# Patient Record
Sex: Female | Born: 2000 | Race: Black or African American | Hispanic: No | Marital: Single | Smoking: Never smoker
Health system: Southern US, Community
[De-identification: ages and names within clinical notes are randomized; demographics above are authoritative.]

## PROBLEM LIST (undated history)

## (undated) ENCOUNTER — Ambulatory Visit (HOSPITAL_COMMUNITY): Payer: Self-pay | Source: Home / Self Care

## (undated) HISTORY — PX: APPENDECTOMY: SHX54

---
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
## 2021-04-30 ENCOUNTER — Encounter: Payer: Self-pay | Admitting: Emergency Medicine

## 2021-04-30 ENCOUNTER — Ambulatory Visit
Admission: EM | Admit: 2021-04-30 | Discharge: 2021-04-30 | Discharge status: Against Medical Advice | Payer: PRIVATE HEALTH INSURANCE

## 2021-04-30 NOTE — ED Triage Notes (Signed)
States she was sent home early from work on Wednesday due to a stomach bug. Her manager says they are going to fire her if she doesn't come in with a doctors note today. States she's feeling better today.

## 2021-04-30 NOTE — ED Notes (Signed)
Patient checked in. Asked if we could give her a work note for yesterday, and that she needed to be discharged in 15 minutes because she had to be at work. Registration told patient she may not be discharged in that amount of time. She stated she would wait to be seen in her car. Called for triage by nurse, no answer. Called phone 3 times, no answer. Removing patient from board.

## 2021-05-12 ENCOUNTER — Encounter (HOSPITAL_COMMUNITY): Payer: Self-pay | Admitting: Emergency Medicine

## 2021-05-12 ENCOUNTER — Ambulatory Visit (HOSPITAL_COMMUNITY)
Admission: EM | Admit: 2021-05-12 | Discharge: 2021-05-12 | Disposition: A | Discharge status: Home / Self Care | Payer: Self-pay | Attending: Medical Oncology | Admitting: Medical Oncology

## 2021-05-12 ENCOUNTER — Other Ambulatory Visit: Payer: Self-pay

## 2021-05-12 DIAGNOSIS — Z20822 Contact with and (suspected) exposure to covid-19: Secondary | ICD-10-CM | POA: Insufficient documentation

## 2021-05-12 DIAGNOSIS — J029 Acute pharyngitis, unspecified: Secondary | ICD-10-CM | POA: Insufficient documentation

## 2021-05-12 DIAGNOSIS — J069 Acute upper respiratory infection, unspecified: Secondary | ICD-10-CM

## 2021-05-12 LAB — POC INFLUENZA A AND B ANTIGEN (URGENT CARE ONLY)
INFLUENZA A ANTIGEN, POC: NEGATIVE
INFLUENZA B ANTIGEN, POC: NEGATIVE

## 2021-05-12 LAB — SARS CORONAVIRUS 2 (TAT 6-24 HRS): SARS Coronavirus 2: NEGATIVE

## 2021-05-12 MED ORDER — FLUTICASONE PROPIONATE 50 MCG/ACT NA SUSP: 2.0000 | Freq: Every day | NASAL | 0 refills | Status: DC

## 2021-05-12 MED ORDER — BENZONATATE 100 MG PO CAPS
100.0000 mg | ORAL_CAPSULE | Freq: Three times a day (TID) | ORAL | 0 refills | Status: DC
Start: 2021-05-12 — End: 2022-06-30
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
## 2021-05-12 NOTE — ED Notes (Signed)
No answer in lobby.

## 2021-05-12 NOTE — ED Notes (Signed)
Called number on file, directly to voicemail with no identifying message.

## 2021-05-12 NOTE — ED Notes (Signed)
Covid and flu swab labeled at bedside and placed in lab

## 2021-05-12 NOTE — ED Triage Notes (Signed)
Complains of sore throat, headache, body aches that started yesterday

## 2022-06-30 ENCOUNTER — Other Ambulatory Visit: Payer: Self-pay

## 2022-06-30 ENCOUNTER — Ambulatory Visit
Admission: EM | Admit: 2022-06-30 | Discharge: 2022-06-30 | Disposition: A | Discharge status: Home / Self Care | Payer: Medicaid Other | Attending: Family Medicine | Admitting: Family Medicine

## 2022-06-30 ENCOUNTER — Encounter: Payer: Self-pay | Admitting: Emergency Medicine

## 2022-06-30 DIAGNOSIS — R051 Acute cough: Secondary | ICD-10-CM | POA: Diagnosis not present

## 2022-06-30 LAB — POCT INFLUENZA A/B
Influenza A, POC: NEGATIVE
Influenza B, POC: NEGATIVE
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
## 2022-06-30 NOTE — ED Triage Notes (Signed)
Pt here for cough x 4 days; denies fever

## 2022-06-30 NOTE — Discharge Instructions (Addendum)
Please reference your MyChart for your rapid flu results.  I would take Robitussin DM or Delsym as needed for your cough

## 2022-08-28 ENCOUNTER — Emergency Department (HOSPITAL_COMMUNITY): Payer: Medicaid Other

## 2022-08-28 ENCOUNTER — Other Ambulatory Visit: Payer: Self-pay

## 2022-08-28 ENCOUNTER — Emergency Department (HOSPITAL_COMMUNITY)
Admission: EM | Admit: 2022-08-28 | Discharge: 2022-08-28 | Disposition: A | Discharge status: Home / Self Care | Payer: Medicaid Other | Attending: Emergency Medicine | Admitting: Emergency Medicine

## 2022-08-28 ENCOUNTER — Encounter (HOSPITAL_COMMUNITY): Payer: Self-pay

## 2022-08-28 DIAGNOSIS — Z1152 Encounter for screening for COVID-19: Secondary | ICD-10-CM | POA: Diagnosis not present

## 2022-08-28 DIAGNOSIS — R042 Hemoptysis: Secondary | ICD-10-CM

## 2022-08-28 LAB — CBC WITH DIFFERENTIAL/PLATELET
Abs Immature Granulocytes: 0.02 10*3/uL (ref 0.00–0.07)
Basophils Absolute: 0.1 10*3/uL (ref 0.0–0.1)
Basophils Relative: 1 %
Eosinophils Absolute: 0.1 10*3/uL (ref 0.0–0.5)
Eosinophils Relative: 1 %
HCT: 46 % (ref 36.0–46.0)
Hemoglobin: 15.8 g/dL — ABNORMAL HIGH (ref 12.0–15.0)
Immature Granulocytes: 0 %
Lymphocytes Relative: 36 %
Lymphs Abs: 3.4 10*3/uL (ref 0.7–4.0)
MCH: 31.5 pg (ref 26.0–34.0)
MCHC: 34.3 g/dL (ref 30.0–36.0)
MCV: 91.6 fL (ref 80.0–100.0)
Monocytes Absolute: 0.8 10*3/uL (ref 0.1–1.0)
Monocytes Relative: 9 %
Neutro Abs: 5.1 10*3/uL (ref 1.7–7.7)
Neutrophils Relative %: 53 %
Platelets: 178 10*3/uL (ref 150–400)
RBC: 5.02 MIL/uL (ref 3.87–5.11)
RDW: 12.2 % (ref 11.5–15.5)
WBC: 9.5 10*3/uL (ref 4.0–10.5)
nRBC: 0 % (ref 0.0–0.2)

## 2022-08-28 LAB — RESP PANEL BY RT-PCR (RSV, FLU A&B, COVID)  RVPGX2
Influenza A by PCR: NEGATIVE
Influenza B by PCR: NEGATIVE
Resp Syncytial Virus by PCR: NEGATIVE
SARS Coronavirus 2 by RT PCR: NEGATIVE

## 2022-08-28 LAB — COMPREHENSIVE METABOLIC PANEL
ALT: 15 U/L (ref 0–44)
AST: 21 U/L (ref 15–41)
Albumin: 5.1 g/dL — ABNORMAL HIGH (ref 3.5–5.0)
Alkaline Phosphatase: 73 U/L (ref 38–126)
Anion gap: 15 (ref 5–15)
BUN: 14 mg/dL (ref 6–20)
CO2: 17 mmol/L — ABNORMAL LOW (ref 22–32)
Calcium: 10.1 mg/dL (ref 8.9–10.3)
Chloride: 105 mmol/L (ref 98–111)
Creatinine, Ser: 0.81 mg/dL (ref 0.44–1.00)
GFR, Estimated: >60 mL/min (ref >60)
Glucose, Bld: 92 mg/dL (ref 70–99)
Potassium: 3.8 mmol/L (ref 3.5–5.1)
Sodium: 137 mmol/L (ref 135–145)
Total Bilirubin: 1 mg/dL (ref 0.3–1.2)
Total Protein: 8.7 g/dL — ABNORMAL HIGH (ref 6.5–8.1)

## 2022-08-28 LAB — I-STAT BETA HCG BLOOD, ED (MC, WL, AP ONLY): I-stat hCG, quantitative: <5 m[IU]/mL (ref <5)

## 2022-08-28 MED ORDER — IOHEXOL 350 MG/ML SOLN
75.0000 mL | Freq: Once | INTRAVENOUS | Status: AC | PRN
  Administered 2022-08-28: 75 mL via INTRAVENOUS
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
## 2022-08-28 NOTE — ED Notes (Signed)
Pt ambulated to restroom without incident.
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
## 2023-05-02 ENCOUNTER — Ambulatory Visit
Admission: EM | Admit: 2023-05-02 | Discharge: 2023-05-02 | Disposition: A | Discharge status: Home / Self Care | Payer: Medicaid Other | Attending: Internal Medicine | Admitting: Internal Medicine

## 2023-05-02 ENCOUNTER — Ambulatory Visit: Payer: Medicaid Other

## 2023-05-02 DIAGNOSIS — M79641 Pain in right hand: Secondary | ICD-10-CM

## 2023-05-02 NOTE — ED Notes (Signed)
Call patient on number listed above. No answer or voicemail to leave a message.
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
## 2023-05-02 NOTE — ED Triage Notes (Signed)
Right hand pain; Pt stated she was punching the stairs this morning when she injured her hand.  Pt is able to move hand and fingers.

## 2023-05-02 NOTE — Discharge Instructions (Signed)
We will call if x-ray results are abnormal.  Elevate and apply ice.  Follow-up with orthopedist if pain persists or worsens.

## 2023-05-03 ENCOUNTER — Telehealth: Payer: Self-pay

## 2023-05-03 NOTE — Telephone Encounter (Signed)
Returning call to patient regarding the following (incoming call):  "Hi, MRN [id number] S. Simones was seen last night, she has a question regarding some xray results and an additional work note. [phone]".   LM for patient to return call and/or respond via MyChart when possible.  B. Roten CMA

## 2023-05-03 NOTE — Telephone Encounter (Signed)
"  I have a very serious concern, my hand & pain is still not better". Advised patient (after discussion with R. Reggie Pile and previously with Ervin Knack, NP, follow up with [facility] (tonight, prior to 8pm) since pain is still present.  B. Roten CMA

## 2023-05-06 ENCOUNTER — Emergency Department (HOSPITAL_COMMUNITY): Payer: Medicaid Other

## 2023-05-06 ENCOUNTER — Encounter (HOSPITAL_COMMUNITY): Payer: Self-pay

## 2023-05-06 ENCOUNTER — Emergency Department (HOSPITAL_COMMUNITY)
Admission: EM | Admit: 2023-05-06 | Discharge: 2023-05-06 | Disposition: A | Discharge status: Home / Self Care | Payer: Medicaid Other | Attending: Emergency Medicine | Admitting: Emergency Medicine

## 2023-05-06 ENCOUNTER — Other Ambulatory Visit: Payer: Self-pay

## 2023-05-06 DIAGNOSIS — S6991XA Unspecified injury of right wrist, hand and finger(s), initial encounter: Secondary | ICD-10-CM | POA: Diagnosis present

## 2023-05-06 DIAGNOSIS — M79641 Pain in right hand: Secondary | ICD-10-CM

## 2023-05-06 DIAGNOSIS — W228XXA Striking against or struck by other objects, initial encounter: Secondary | ICD-10-CM | POA: Insufficient documentation

## 2023-05-06 MED ORDER — NAPROXEN 375 MG PO TABS: 375.0000 mg | ORAL_TABLET | Freq: Two times a day (BID) | ORAL | 0 refills | Status: DC

## 2023-05-06 MED ORDER — IBUPROFEN 800 MG PO TABS
800.0000 mg | ORAL_TABLET | Freq: Once | ORAL | Status: AC
  Administered 2023-05-06: 800 mg via ORAL
  Filled 2023-05-06: qty 1

## 2023-05-06 NOTE — ED Provider Notes (Signed)
[facility] Provider Note   CSN: [id number] Arrival date & time: 05/06/23  1409     History  Chief Complaint  Patient presents with   Hand Pain    [name] is a 22 y.o. female who presents emergency department for evaluation of her right hand injury.  She was seen for this already at an urgent care had an outpatient x-ray that was negative for acute injuries wearing a radial gutter removable Velcro brace and has follow-up with orthopedics but states they did not give her anything for pain.  She also did not take anything at home for pain.  She has not used ice.  The injury is to her right hand.  She is left-hand dominant.  She denies numbness or tingling.   Hand Pain       Home Medications Prior to Admission medications   Medication Sig Start Date End Date Taking? Authorizing Provider  fluticasone (FLONASE) 50 MCG/ACT nasal spray Place 2 sprays into both nostrils daily. 05/12/21   Rushie Chestnut, PA-C  diphenhydrAMINE (BENADRYL) 12.5 MG/5ML liquid Take 10 mLs (25 mg total) by mouth every 6 (six) hours as needed for itching. 05/26/13 06/03/19  Marcellina Millin, MD      Allergies    Patient has no known allergies.    Review of Systems   Review of Systems  Physical Exam Updated Vital Signs BP 115/78 (BP Location: Left Arm)   Pulse 84   Temp 98.7 F (37.1 C)   Resp 14   Ht 5\' 4"  (1.626 m)   Wt 57.6 kg   LMP 04/30/2023 (Approximate)   SpO2 100%   BMI 21.80 kg/m  Physical Exam Vitals and nursing note reviewed.  Constitutional:      General: She is not in acute distress.    Appearance: She is well-developed. She is not diaphoretic.  HENT:     Head: Normocephalic and atraumatic.     Right Ear: External ear normal.     Left Ear: External ear normal.     Nose: Nose normal.     Mouth/Throat:     Mouth: Mucous membranes are moist.  Eyes:     General: No scleral icterus.    Conjunctiva/sclera: Conjunctivae normal.   Cardiovascular:     Rate and Rhythm: Normal rate and regular rhythm.     Heart sounds: Normal heart sounds. No murmur heard.    No friction rub. No gallop.  Pulmonary:     Effort: Pulmonary effort is normal. No respiratory distress.     Breath sounds: Normal breath sounds.  Abdominal:     General: Bowel sounds are normal. There is no distension.     Palpations: Abdomen is soft. There is no mass.     Tenderness: There is no abdominal tenderness. There is no guarding.  Musculoskeletal:     Cervical back: Normal range of motion.     Comments: Brace removed, no obvious swelling or bruising.  Old appearing subungual hematoma of the right thumb.  Cap refill less than 2 seconds.  Patient resistant to movement of the fingers due to pain.  Pulses 2+  Skin:    General: Skin is warm and dry.  Neurological:     Mental Status: She is alert and oriented to person, place, and time.  Psychiatric:        Behavior: Behavior normal.     ED Results / Procedures / Treatments   Labs (all labs ordered are  listed, but only abnormal results are displayed) Labs Reviewed - No data to display  EKG None  Radiology DG Hand Complete Right  Result Date: 05/06/2023 CLINICAL DATA:  Right hand pain EXAM: RIGHT HAND - COMPLETE 3+ VIEW COMPARISON:  None Available. FINDINGS: There is no evidence of fracture or dislocation. There is no evidence of arthropathy or other focal bone abnormality. Soft tissues are unremarkable. IMPRESSION: Negative. Electronically Signed   By: Judie Petit.  Shick M.D.   On: 05/06/2023 14:37    Procedures Procedures    Medications Ordered in ED Medications  ibuprofen (ADVIL) tablet 800 mg (has no administration in time range)    ED Course/ Medical Decision Making/ A&P                                 Medical Decision Making Amount and/or Complexity of Data Reviewed Radiology: ordered.  Risk Prescription drug management.   Patient here with right hand pain.  Patient given ibuprofen  here in the emergency department.  Will discharge with naproxen.  Follow-up with Ortho hand.  Discussed return precautions.        Final Clinical Impression(s) / ED Diagnoses Final diagnoses:  Hand pain, right    Rx / DC Orders ED Discharge Orders     None         Arthor Captain, PA-C 05/06/23 1542    Charlynne Pander, MD 05/06/23 [phone]

## 2023-05-06 NOTE — Discharge Instructions (Signed)
Follow up with [facility] Return to the ER with any new or worsening symptoms
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
## 2023-10-07 ENCOUNTER — Emergency Department (HOSPITAL_COMMUNITY)
Admission: EM | Admit: 2023-10-07 | Discharge: 2023-10-08 | Discharge status: Against Medical Advice | Payer: Self-pay | Attending: Emergency Medicine | Admitting: Emergency Medicine

## 2023-10-07 DIAGNOSIS — Z5321 Procedure and treatment not carried out due to patient leaving prior to being seen by health care provider: Secondary | ICD-10-CM | POA: Insufficient documentation

## 2023-10-07 NOTE — ED Notes (Signed)
 This NT went to lobby to speak with pt. Pt stated "I left my kids at home and I need to go home and get them. I might come back." Pt left the ED.

## 2023-10-07 NOTE — ED Notes (Signed)
 Pt not in the triage treatment room  x 3

## 2024-02-05 ENCOUNTER — Encounter (HOSPITAL_COMMUNITY): Payer: Self-pay

## 2024-02-05 ENCOUNTER — Emergency Department (HOSPITAL_COMMUNITY)
Admission: EM | Admit: 2024-02-05 | Discharge: 2024-02-06 | Discharge status: Against Medical Advice | Payer: Self-pay | Attending: Emergency Medicine | Admitting: Emergency Medicine

## 2024-02-05 ENCOUNTER — Other Ambulatory Visit: Payer: Self-pay

## 2024-02-05 DIAGNOSIS — Z5321 Procedure and treatment not carried out due to patient leaving prior to being seen by health care provider: Secondary | ICD-10-CM | POA: Insufficient documentation

## 2024-02-05 DIAGNOSIS — M549 Dorsalgia, unspecified: Secondary | ICD-10-CM | POA: Insufficient documentation

## 2024-02-05 NOTE — ED Triage Notes (Signed)
 Complaining of back pain between the shoulder blades. It is burning in nature. 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

## 2024-02-06 NOTE — ED Notes (Signed)
 PT LEFT AMA at 1:40am

## 2024-04-24 ENCOUNTER — Emergency Department (HOSPITAL_COMMUNITY)
Admission: EM | Admit: 2024-04-24 | Discharge: 2024-04-24 | Disposition: A | Discharge status: Home / Self Care | Payer: Self-pay | Attending: Emergency Medicine | Admitting: Emergency Medicine

## 2024-04-24 ENCOUNTER — Other Ambulatory Visit: Payer: Self-pay

## 2024-04-24 DIAGNOSIS — Z202 Contact with and (suspected) exposure to infections with a predominantly sexual mode of transmission: Secondary | ICD-10-CM | POA: Insufficient documentation

## 2024-04-24 DIAGNOSIS — N898 Other specified noninflammatory disorders of vagina: Secondary | ICD-10-CM | POA: Insufficient documentation

## 2024-04-24 LAB — WET PREP, GENITAL
Clue Cells Wet Prep HPF POC: NONE SEEN
Sperm: NONE SEEN
Trich, Wet Prep: NONE SEEN
WBC, Wet Prep HPF POC: <10 (ref <10)
Yeast Wet Prep HPF POC: NONE SEEN

## 2024-04-24 MED ORDER — STERILE WATER FOR INJECTION IJ SOLN
INTRAMUSCULAR | Status: AC
  Administered 2024-04-24: 10 mL
  Filled 2024-04-24: qty 10

## 2024-04-24 MED ORDER — AZITHROMYCIN 1 G PO PACK
1.0000 g | PACK | Freq: Once | ORAL | Status: AC
  Administered 2024-04-24: 1 g via ORAL
  Filled 2024-04-24: qty 1

## 2024-04-24 MED ORDER — CEFTRIAXONE SODIUM 1 G IJ SOLR
500.0000 mg | Freq: Once | INTRAMUSCULAR | Status: DC
  Filled 2024-04-24: qty 10

## 2024-04-24 NOTE — ED Provider Notes (Signed)
  EMERGENCY DEPARTMENT AT [facility] Provider Note   CSN: [id number] Arrival date & time: 04/24/24  9542     Patient presents with: Exposure to STD   [name] is a 23 y.o. female.   The history is provided by the patient.  Vaginal Discharge Severity:  Moderate Onset quality:  Gradual Duration:  2 weeks Timing:  Constant Progression:  Unchanged Chronicity:  New Context: spontaneously   Relieved by:  Nothing Worsened by:  Nothing Ineffective treatments:  None tried Associated symptoms: no dysuria, no fever, no urinary hesitancy, no urinary incontinence and no vomiting   Risk factors: no PID, no prior miscarriage and no terminated pregnancy   Patient also has multiple complaints that have been going on for some time.  No missed periods.       Prior to Admission medications   Medication Sig Start Date End Date Taking? Authorizing Provider  fluticasone  (FLONASE ) 50 MCG/ACT nasal spray Place 2 sprays into both nostrils daily. 05/12/21   Tonette Lauraine HERO, PA-C  naproxen  (NAPROSYN ) 375 MG tablet Take 1 tablet (375 mg total) by mouth 2 (two) times daily with a meal. 05/06/23   Harris, Abigail, PA-C  diphenhydrAMINE  (BENADRYL ) 12.5 MG/5ML liquid Take 10 mLs (25 mg total) by mouth every 6 (six) hours as needed for itching. 05/26/13 06/03/19  Rhae Lye, MD    Allergies: Patient has no known allergies.    Review of Systems  Constitutional:  Negative for fever.  Respiratory:  Negative for cough, shortness of breath, wheezing and stridor.   Cardiovascular:  Negative for chest pain.  Gastrointestinal:  Negative for vomiting.  Genitourinary:  Positive for vaginal discharge. Negative for bladder incontinence, dysuria and hesitancy.  All other systems reviewed and are negative.   Updated Vital Signs BP 118/78 (BP Location: Left Arm)   Pulse 79   Temp 97.8 F (36.6 C) (Oral)   Resp 16   Ht 5' 4 (1.626 m)   Wt 53.5 kg   SpO2 100%   BMI 20.25 kg/m    Physical Exam Vitals and nursing note reviewed.  Constitutional:      General: She is not in acute distress.    Appearance: Normal appearance. She is well-developed.  HENT:     Head: Normocephalic and atraumatic.     Nose: Nose normal.     Mouth/Throat:     Mouth: Mucous membranes are moist.     Pharynx: Oropharynx is clear.  Eyes:     Pupils: Pupils are equal, round, and reactive to light.  Cardiovascular:     Rate and Rhythm: Normal rate and regular rhythm.     Pulses: Normal pulses.     Heart sounds: Normal heart sounds.  Pulmonary:     Effort: Pulmonary effort is normal. No respiratory distress.     Breath sounds: Normal breath sounds.  Abdominal:     General: Bowel sounds are normal. There is no distension.     Palpations: Abdomen is soft.     Tenderness: There is no abdominal tenderness. There is no guarding or rebound.  Musculoskeletal:        General: Normal range of motion.     Cervical back: Normal range of motion and neck supple. No rigidity.  Lymphadenopathy:     Head:     Right side of head: No submental, submandibular, tonsillar, preauricular, posterior auricular or occipital adenopathy.     Left side of head: No submental, submandibular, tonsillar, preauricular, posterior auricular or occipital  adenopathy.     Cervical: No cervical adenopathy.     Right cervical: No superficial, deep or posterior cervical adenopathy.    Left cervical: No superficial, deep or posterior cervical adenopathy.     Upper Body:     Right upper body: No supraclavicular, axillary or epitrochlear adenopathy.     Left upper body: No supraclavicular, axillary or epitrochlear adenopathy.  Skin:    General: Skin is warm and dry.     Capillary Refill: Capillary refill takes less than 2 seconds.     Findings: No erythema or rash.  Neurological:     General: No focal deficit present.     Mental Status: She is alert and oriented to person, place, and time.     Deep Tendon Reflexes:  Reflexes normal.  Psychiatric:        Mood and Affect: Mood normal.     (all labs ordered are listed, but only abnormal results are displayed) Labs Reviewed  WET PREP, GENITAL  GC/CHLAMYDIA PROBE AMP () NOT AT [facility]    EKG: None  Radiology: No results found.   Procedures   Medications Ordered in the ED - No data to display                                  Medical Decision Making Patient reports change in her discharge for several weeks   Amount and/or Complexity of Data Reviewed External Data Reviewed: notes.    Details: Previous notes reviewed  Labs: ordered.    Details: Wet prep is normal without any signs of infection  Risk Prescription drug management. Risk Details: Patient would like to be treated for her symptoms.  Antibiotics were ordered.  Stable for discharge with close follow up.       Final diagnoses:  None  The patient is nontoxic-appearing on exam and vital signs are within normal limits.  I have reviewed the triage vital signs and the nursing notes. Pertinent labs & imaging results that were available during my care of the patient were reviewed by me and considered in my medical decision making (see chart for details). After history, exam, and medical workup I feel the patient has been appropriately medically screened and is safe for discharge home. Pertinent diagnoses were discussed with the patient. Patient was given return precautions.   ED Discharge Orders     None          Lindley Stachnik, MD 04/24/24 [phone]

## 2024-04-24 NOTE — ED Provider Notes (Signed)
 She believes her lymph nodes are swollen and needs an antibiotic.  They are not Patient thinks she has pneumonia without being able to give symptoms (no fever, no cough, no SOB) but lungs are clear and vitals are normal Patient thinks she has an infection somewhere in body that her father who does not live here thinks she needs antibiotics for    [facility], Erika Cisse, MD 04/24/24 [phone]

## 2024-04-24 NOTE — ED Notes (Signed)
 Patient refused IM antibiotic despite RN teaching. Patient stated its gonna hurt. Discharge papers given.

## 2024-04-24 NOTE — ED Triage Notes (Signed)
 Patient reports vaginal discharge for the past two weeks, with worsening symptoms including a foul odor. Patient reports vaginal bleeding x 4 days. Patient denies dysuria. Denies N/V and fever.

## 2024-04-25 LAB — GC/CHLAMYDIA PROBE AMP (~~LOC~~) NOT AT ARMC
Chlamydia: POSITIVE — AB
Comment: NEGATIVE
Comment: NORMAL
Neisseria Gonorrhea: NEGATIVE

## 2024-04-26 ENCOUNTER — Ambulatory Visit (HOSPITAL_COMMUNITY): Payer: Self-pay

## 2024-04-29 ENCOUNTER — Other Ambulatory Visit: Payer: Self-pay

## 2024-04-29 ENCOUNTER — Encounter (HOSPITAL_COMMUNITY): Payer: Self-pay | Admitting: Emergency Medicine

## 2024-04-29 ENCOUNTER — Emergency Department (HOSPITAL_COMMUNITY)
Admission: EM | Admit: 2024-04-29 | Discharge: 2024-04-30 | Disposition: A | Discharge status: Home / Self Care | Payer: Self-pay | Attending: Emergency Medicine | Admitting: Emergency Medicine

## 2024-04-29 DIAGNOSIS — E876 Hypokalemia: Secondary | ICD-10-CM | POA: Insufficient documentation

## 2024-04-29 DIAGNOSIS — R112 Nausea with vomiting, unspecified: Secondary | ICD-10-CM | POA: Insufficient documentation

## 2024-04-29 DIAGNOSIS — D72829 Elevated white blood cell count, unspecified: Secondary | ICD-10-CM | POA: Insufficient documentation

## 2024-04-29 DIAGNOSIS — R8290 Unspecified abnormal findings in urine: Secondary | ICD-10-CM | POA: Insufficient documentation

## 2024-04-29 LAB — URINALYSIS, ROUTINE W REFLEX MICROSCOPIC
Bilirubin Urine: NEGATIVE
Glucose, UA: NEGATIVE mg/dL
Ketones, ur: NEGATIVE mg/dL
Nitrite: POSITIVE — AB
Protein, ur: NEGATIVE mg/dL
Specific Gravity, Urine: 1.025 (ref 1.005–1.030)
pH: 5 (ref 5.0–8.0)

## 2024-04-29 LAB — COMPREHENSIVE METABOLIC PANEL WITH GFR
ALT: 10 U/L (ref 0–44)
AST: 18 U/L (ref 15–41)
Albumin: 4.1 g/dL (ref 3.5–5.0)
Alkaline Phosphatase: 75 U/L (ref 38–126)
Anion gap: 14 (ref 5–15)
BUN: 7 mg/dL (ref 6–20)
CO2: 20 mmol/L — ABNORMAL LOW (ref 22–32)
Calcium: 9.1 mg/dL (ref 8.9–10.3)
Chloride: 102 mmol/L (ref 98–111)
Creatinine, Ser: 0.77 mg/dL (ref 0.44–1.00)
GFR, Estimated: >60 mL/min (ref >60)
Glucose, Bld: 122 mg/dL — ABNORMAL HIGH (ref 70–99)
Potassium: 3.3 mmol/L — ABNORMAL LOW (ref 3.5–5.1)
Sodium: 136 mmol/L (ref 135–145)
Total Bilirubin: 0.4 mg/dL (ref 0.0–1.2)
Total Protein: 7.2 g/dL (ref 6.5–8.1)

## 2024-04-29 LAB — CBC WITH DIFFERENTIAL/PLATELET
Abs Immature Granulocytes: 0.1 K/uL — ABNORMAL HIGH (ref 0.00–0.07)
Basophils Absolute: 0 K/uL (ref 0.0–0.1)
Basophils Relative: 0 %
Eosinophils Absolute: 0 K/uL (ref 0.0–0.5)
Eosinophils Relative: 0 %
HCT: 42.6 % (ref 36.0–46.0)
Hemoglobin: 14.8 g/dL (ref 12.0–15.0)
Immature Granulocytes: 1 %
Lymphocytes Relative: 6 %
Lymphs Abs: 1.1 K/uL (ref 0.7–4.0)
MCH: 31.4 pg (ref 26.0–34.0)
MCHC: 34.7 g/dL (ref 30.0–36.0)
MCV: 90.3 fL (ref 80.0–100.0)
Monocytes Absolute: 1.1 K/uL — ABNORMAL HIGH (ref 0.1–1.0)
Monocytes Relative: 6 %
Neutro Abs: 17 K/uL — ABNORMAL HIGH (ref 1.7–7.7)
Neutrophils Relative %: 87 %
Platelets: 298 K/uL (ref 150–400)
RBC: 4.72 MIL/uL (ref 3.87–5.11)
RDW: 11.9 % (ref 11.5–15.5)
WBC: 19.4 K/uL — ABNORMAL HIGH (ref 4.0–10.5)
nRBC: 0 % (ref 0.0–0.2)

## 2024-04-29 LAB — LIPASE, BLOOD: Lipase: 22 U/L (ref 11–51)

## 2024-04-29 LAB — HCG, SERUM, QUALITATIVE: Preg, Serum: NEGATIVE

## 2024-04-29 MED ORDER — ONDANSETRON 4 MG PO TBDP
4.0000 mg | ORAL_TABLET | Freq: Once | ORAL | Status: AC
  Administered 2024-04-29: 4 mg via ORAL
  Filled 2024-04-29: qty 1

## 2024-04-29 NOTE — ED Triage Notes (Addendum)
 Patient states I just throwing up, and I feel like I need fluids. Patient drinking water in triage and asking for ice chips.

## 2024-04-29 NOTE — ED Provider Triage Note (Signed)
 Emergency Medicine Provider Triage Evaluation Note  [name] , a 23 y.o. female  was evaluated in triage.  Pt complains of abd pain. Endorse abdominal cramping with nausea and vomiting today.  Feels dehydrated.  No fever, no dysuria.  Admits to marijuana use  Review of Systems  Positive: As above Negative: As above  Physical Exam  BP 118/73   Pulse 60   Temp 97.6 F (36.4 C) (Oral)   Resp (!) 24   Wt 54 kg   SpO2 99%   BMI 20.43 kg/m  Gen:   Awake, no distress   Resp:  Normal effort  MSK:   Moves extremities without difficulty  Other:  Vomited all over the floor.  Medical Decision Making  Medically screening exam initiated at 7:56 PM.  Appropriate orders placed.  Merve Hotard was informed that the remainder of the evaluation will be completed by another provider, this initial triage assessment does not replace that evaluation, and the importance of remaining in the ED until their evaluation is complete.     Nivia Colon, PA-C 04/29/24 [phone]

## 2024-04-29 NOTE — ED Triage Notes (Signed)
 Patient reports pain across abdomen with emesis onset today , no diarrhea/denies fever .

## 2024-04-30 MED ORDER — CEPHALEXIN 500 MG PO CAPS: 500.0000 mg | ORAL_CAPSULE | Freq: Two times a day (BID) | ORAL | 0 refills | Status: DC

## 2024-04-30 MED ORDER — ONDANSETRON HCL 4 MG/2ML IJ SOLN
4.0000 mg | Freq: Once | INTRAMUSCULAR | Status: AC
  Administered 2024-04-30: 4 mg via INTRAVENOUS
  Filled 2024-04-30: qty 2

## 2024-04-30 MED ORDER — MORPHINE SULFATE (PF) 4 MG/ML IV SOLN
4.0000 mg | Freq: Once | INTRAVENOUS | Status: AC
  Administered 2024-04-30: 4 mg via INTRAVENOUS
  Filled 2024-04-30: qty 1

## 2024-04-30 MED ORDER — ONDANSETRON 4 MG PO TBDP: 4.0000 mg | ORAL_TABLET | Freq: Three times a day (TID) | ORAL | 0 refills | Status: DC | PRN

## 2024-04-30 MED ORDER — LACTATED RINGERS IV BOLUS
1000.0000 mL | Freq: Once | INTRAVENOUS | Status: AC
  Administered 2024-04-30: 1000 mL via INTRAVENOUS

## 2024-04-30 MED ORDER — SODIUM CHLORIDE 0.9 % IV SOLN
1.0000 g | Freq: Once | INTRAVENOUS | Status: AC
  Administered 2024-04-30: 1 g via INTRAVENOUS
  Filled 2024-04-30: qty 10

## 2024-04-30 NOTE — ED Provider Notes (Signed)
 [facility] Emergency Department Provider Note MRN:  [id number]  Arrival date & time: 04/30/24     Chief Complaint   Abdominal Pain and Emesis   History of Present Illness   [name] is a 23 y.o. year-old female presents to the ED with chief complaint of nausea and vomiting.  Onset was earlier today.  She states that her brother was sick with a stomach bug yesterday.  She states that she has not had any diarrhea today.  She has had persistent nausea and vomiting while waiting in the emergency department.  She states that she has crampy abdominal discomfort.  She denies fevers or chills.  She denies any dysuria or hematuria.  She reports prior appendectomy.  History provided by patient.   Review of Systems  Pertinent positive and negative review of systems noted in HPI.    Physical Exam   Vitals:   04/30/24 0345 04/30/24 0356  BP: 109/63   Pulse: 66   Resp: 16   Temp:  98.6 F (37 C)  SpO2: 100%     CONSTITUTIONAL:  non toxic-appearing, NAD NEURO:  Alert and oriented x 3, CN 3-12 grossly intact EYES:  eyes equal and reactive ENT/NECK:  Supple, no stridor  CARDIO:  normal rate, regular rhythm, appears well-perfused  PULM:  No respiratory distress, CTAB GI/GU:  non-distended, generalized abdominal discomfort, but without focal tenderness MSK/SPINE:  No gross deformities, no edema, moves all extremities  SKIN:  no rash, atraumatic   *Additional and/or pertinent findings included in MDM below  Diagnostic and Interventional Summary    EKG Interpretation Date/Time:    Ventricular Rate:    PR Interval:    QRS Duration:    QT Interval:    QTC Calculation:   R Axis:      Text Interpretation:         Labs Reviewed  CBC WITH DIFFERENTIAL/PLATELET - Abnormal; Notable for the following components:      Result Value   WBC 19.4 (*)    Neutro Abs 17.0 (*)    Monocytes Absolute 1.1 (*)    Abs Immature Granulocytes 0.10 (*)    All other  components within normal limits  COMPREHENSIVE METABOLIC PANEL WITH GFR - Abnormal; Notable for the following components:   Potassium 3.3 (*)    CO2 20 (*)    Glucose, Bld 122 (*)    All other components within normal limits  URINALYSIS, ROUTINE W REFLEX MICROSCOPIC - Abnormal; Notable for the following components:   Color, Urine AMBER (*)    APPearance HAZY (*)    Hgb urine dipstick SMALL (*)    Nitrite POSITIVE (*)    Leukocytes,Ua TRACE (*)    Bacteria, UA MANY (*)    All other components within normal limits  URINE CULTURE  LIPASE, BLOOD  HCG, SERUM, QUALITATIVE    No orders to display    Medications  ondansetron  (ZOFRAN -ODT) disintegrating tablet 4 mg (4 mg Oral Given 04/29/24 1959)  lactated ringers  bolus 1,000 mL (0 mLs Intravenous Stopped 04/30/24 0255)  ondansetron  (ZOFRAN ) injection 4 mg (4 mg Intravenous Given 04/30/24 0134)  morphine (PF) 4 MG/ML injection 4 mg (4 mg Intravenous Given 04/30/24 0135)  cefTRIAXone (ROCEPHIN) 1 g in sodium chloride  0.9 % 100 mL IVPB (0 g Intravenous Stopped 04/30/24 0218)     Procedures  /  Critical Care Procedures  ED Course and Medical Decision Making  I have reviewed the triage vital signs, the nursing notes, and  pertinent available records from the EMR.  Social Determinants Affecting Complexity of Care: Patient .   ED Course: Clinical Course as of 04/30/24 0538  Tue Apr 30, 2024  0111 Comprehensive metabolic panel(!) Mild hypokalemia at 3.3 [RB]  0111 CBC with Differential(!) Leukocytosis to 19.4, while she denies any dysuria, she does have nitrite positive urine.  I will treat her with Rocephin and send urine for culture. [RB]  0112 hCG, serum, qualitative Pregnancy test negative [RB]  0112 I do not feel that we need CT imaging.  Patient has history of appendectomy.  She denies flank pain.  Normal renal function, my suspicion of pyelonephritis is lower given that her brother just had gastroenteritis yesterday. [RB]     Clinical Course User Index [RB] Vicky Charleston, PA-C    Medical Decision Making Patient here with nausea and vomiting.  Onset was suddenly this afternoon.  Patient's family member/sibling had been sick with similar yesterday.  She is noted to have a leukocytosis to 19, suspect this is reactive.  She does not have any focal tenderness or guarding.  She does have prior history of appendectomy.  She is not pregnant.  She denies any dysuria, hematuria, or flank pain.  She does have an abnormal urinalysis, I will treat this with Rocephin and sent for culture, but due to her being asymptomatic, I do not think this is pyelonephritis.  She is not febrile.  She looks well.  She is tolerating fluids after Zofran .  She states that she feels improved.  Will plan for discharge.  Amount and/or Complexity of Data Reviewed Labs: ordered. Decision-making details documented in ED Course.  Risk Prescription drug management.         Consultants: No consultations were needed in caring for this patient.   Treatment and Plan: I considered admission due to patient's initial presentation, but after considering the examination and diagnostic results, patient will not require admission and can be discharged with outpatient follow-up.    Final Clinical Impressions(s) / ED Diagnoses     ICD-10-CM   1. Nausea and vomiting, unspecified vomiting type  R11.2     2. Abnormal urinalysis  R82.90       ED Discharge Orders          Ordered    cephALEXin (KEFLEX) 500 MG capsule  2 times daily        04/30/24 0412    ondansetron  (ZOFRAN -ODT) 4 MG disintegrating tablet  Every 8 hours PRN        04/30/24 9587              Discharge Instructions Discussed with and Provided to Patient:   Discharge Instructions   None      Vicky Charleston, PA-C 04/30/24 0539    Carita Senior, MD 04/30/24 [phone]

## 2024-04-30 NOTE — ED Notes (Signed)
 PO challenge; pt tolerated water well.

## 2024-05-02 LAB — URINE CULTURE: Culture: >=100000 — AB

## 2024-05-03 ENCOUNTER — Telehealth (HOSPITAL_BASED_OUTPATIENT_CLINIC_OR_DEPARTMENT_OTHER): Payer: Self-pay | Admitting: Emergency Medicine

## 2024-05-03 NOTE — Telephone Encounter (Signed)
 Post ED Visit - Positive Culture Follow-up  Culture report reviewed by antimicrobial stewardship pharmacist: [facility] []  Rankin Dee, Pharm.D. []  Venetia Gully, Pharm.D., BCPS AQ-ID []  Garrel Crews, Pharm.D., BCPS []  Almarie Lunger, Pharm.D., BCPS []  [place], [address].D., BCPS, AAHIVP []  Rosaline Bihari, Pharm.D., BCPS, AAHIVP []  Vernell Meier, PharmD, BCPS []  Latanya Hint, PharmD, BCPS []  Donald Medley, PharmD, BCPS [x]  Vito Ralph, PharmD []  Dorothyann Alert, PharmD, BCPS []  Morene Babe, PharmD  [facility] []  Rosaline Edison, PharmD []  Romona Bliss, PharmD []  Dolphus Roller, PharmD []  Veva Seip, Rph []  Vernell Daunt) Leonce, PharmD []  Eva Allis, PharmD []  Rosaline Millet, PharmD []  Iantha Batch, PharmD []  Arvin Gauss, PharmD []  Wanda Hasting, PharmD []  Ronal Rav, PharmD []  Rocky Slade, PharmD []  Bard Jeans, PharmD   Positive urine culture Treated with Cephalexin, organism sensitive to the same and no further patient follow-up is required at this time.  [name] [name] 05/03/2024, 3:24 PM

## 2024-06-02 ENCOUNTER — Encounter (HOSPITAL_COMMUNITY): Payer: Self-pay | Admitting: *Deleted

## 2024-06-02 ENCOUNTER — Other Ambulatory Visit: Payer: Self-pay

## 2024-06-02 ENCOUNTER — Emergency Department (HOSPITAL_COMMUNITY)
Admission: EM | Admit: 2024-06-02 | Discharge: 2024-06-02 | Disposition: A | Discharge status: Home / Self Care | Payer: Self-pay | Attending: Emergency Medicine | Admitting: Emergency Medicine

## 2024-06-02 DIAGNOSIS — A749 Chlamydial infection, unspecified: Secondary | ICD-10-CM | POA: Insufficient documentation

## 2024-06-02 MED ORDER — LIDOCAINE HCL (PF) 1 % IJ SOLN: 2.0000 mL | Freq: Once | INTRAMUSCULAR | Status: DC

## 2024-06-02 MED ORDER — LIDOCAINE HCL 2 % IJ SOLN
INTRAMUSCULAR | Status: AC
  Filled 2024-06-02: qty 20

## 2024-06-02 MED ORDER — DOXYCYCLINE HYCLATE 100 MG PO CAPS: 100.0000 mg | ORAL_CAPSULE | Freq: Two times a day (BID) | ORAL | 0 refills | Status: DC

## 2024-06-02 MED ORDER — DOXYCYCLINE HYCLATE 100 MG PO TABS
100.0000 mg | ORAL_TABLET | Freq: Once | ORAL | Status: AC
  Administered 2024-06-02: 100 mg via ORAL
  Filled 2024-06-02: qty 1

## 2024-06-02 MED ORDER — CEFTRIAXONE SODIUM 500 MG IJ SOLR
500.0000 mg | Freq: Once | INTRAMUSCULAR | Status: AC
  Administered 2024-06-02: 500 mg via INTRAMUSCULAR
  Filled 2024-06-02: qty 500

## 2024-06-02 MED ORDER — DEXTROSE 5 % IV SOLN: 500.0000 mg | Freq: Once | INTRAVENOUS | Status: DC

## 2024-06-02 NOTE — Discharge Instructions (Addendum)
 We evaluated you for your positive chlamydia test.  Since you have not yet had treatment, we have treated you for this infection with antibiotics.  We have also sent a repeat test since your previous test was around 1 month ago.  Please avoid sex until you have completed your treatment.  Please notify any recent sexual partners that you tested positive so they may also be treated.  If you have any new or worsening symptoms such as vomiting, fevers, severe abdominal pain, persistent symptoms, please return to the emergency department for recheck.

## 2024-06-02 NOTE — ED Notes (Signed)
 I have spoken with dr francesca he just wants her brought back for treatment since she has documented pos std from [facility] ed that was not treated there
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
## 2024-06-02 NOTE — ED Triage Notes (Signed)
 First Nurse Note: Pt deferred providing c/c while in the public lobby. Pt appears in NAD.

## 2024-06-02 NOTE — ED Notes (Signed)
 The pt was seen at [facility] long ed October 20 she reports that she was told then that everything was good but she checked her chart days later and was pos  apparently  they tried to call and get her back in but she did not receive the call  she still has a vaginal discharge with an odor

## 2024-06-03 ENCOUNTER — Ambulatory Visit (HOSPITAL_COMMUNITY): Payer: Self-pay

## 2024-06-03 LAB — GC/CHLAMYDIA PROBE AMP (~~LOC~~) NOT AT ARMC
Chlamydia: NEGATIVE
Comment: NEGATIVE
Comment: NORMAL
Neisseria Gonorrhea: NEGATIVE

## 2024-06-04 ENCOUNTER — Telehealth (HOSPITAL_COMMUNITY): Payer: Self-pay

## 2024-06-04 NOTE — Telephone Encounter (Signed)
 Pt. Called , id verified, had questions concerning her lab results from her recent ED visits on 05/25/2024 and 06/02/2024.  All questions answered and pt verbalized understanding.   Pt. Voiced that she was having symptoms encouraged pt. To follow-up with PCP or return to the ED for evaluation.

## 2024-06-28 ENCOUNTER — Encounter: Payer: Self-pay | Admitting: Emergency Medicine

## 2024-06-28 ENCOUNTER — Ambulatory Visit
Admission: EM | Admit: 2024-06-28 | Discharge: 2024-06-28 | Disposition: A | Discharge status: Home / Self Care | Payer: Self-pay | Attending: Family Medicine | Admitting: Family Medicine

## 2024-06-28 DIAGNOSIS — N898 Other specified noninflammatory disorders of vagina: Secondary | ICD-10-CM | POA: Insufficient documentation

## 2024-06-28 MED ORDER — METRONIDAZOLE 500 MG PO TABS: 500.0000 mg | ORAL_TABLET | Freq: Two times a day (BID) | ORAL | 0 refills | Status: AC

## 2024-06-28 NOTE — Discharge Instructions (Addendum)
 Advised patient take medication as directed with food to completion.  Encouraged increase daily water intake to 64 ounces per day while taking this medication.  Advised if symptoms worsen and/or unresolved please follow-up with PCP or here for further evaluation.
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
## 2024-06-28 NOTE — ED Triage Notes (Signed)
 Patient states that she's having vaginal discharge w/an odor, clear for several days.  Patient thinks she may have BV.  Recently tested and diagnosed Chlamydia in 05/2024, started treatment on 06/08/2024.

## 2024-07-01 ENCOUNTER — Ambulatory Visit (HOSPITAL_COMMUNITY): Payer: Self-pay

## 2024-07-01 LAB — CERVICOVAGINAL ANCILLARY ONLY
Bacterial Vaginitis (gardnerella): POSITIVE — AB
Candida Glabrata: NEGATIVE
Candida Vaginitis: NEGATIVE
Comment: NEGATIVE
Comment: NEGATIVE
Comment: NEGATIVE

## 2024-07-23 ENCOUNTER — Other Ambulatory Visit: Payer: Self-pay

## 2024-07-23 ENCOUNTER — Emergency Department (HOSPITAL_BASED_OUTPATIENT_CLINIC_OR_DEPARTMENT_OTHER)
Admission: EM | Admit: 2024-07-23 | Discharge: 2024-07-23 | Disposition: A | Discharge status: Home / Self Care | Payer: Self-pay | Attending: Emergency Medicine | Admitting: Emergency Medicine

## 2024-07-23 DIAGNOSIS — R112 Nausea with vomiting, unspecified: Secondary | ICD-10-CM | POA: Insufficient documentation

## 2024-07-23 DIAGNOSIS — R1084 Generalized abdominal pain: Secondary | ICD-10-CM | POA: Insufficient documentation

## 2024-07-23 DIAGNOSIS — R110 Nausea: Secondary | ICD-10-CM

## 2024-07-23 LAB — COMPREHENSIVE METABOLIC PANEL WITH GFR
ALT: 7 U/L (ref 0–44)
AST: 17 U/L (ref 15–41)
Albumin: 4.6 g/dL (ref 3.5–5.0)
Alkaline Phosphatase: 77 U/L (ref 38–126)
Anion gap: 15 (ref 5–15)
BUN: 10 mg/dL (ref 6–20)
CO2: 20 mmol/L — ABNORMAL LOW (ref 22–32)
Calcium: 9.7 mg/dL (ref 8.9–10.3)
Chloride: 104 mmol/L (ref 98–111)
Creatinine, Ser: 0.71 mg/dL (ref 0.44–1.00)
GFR, Estimated: >60 mL/min
Glucose, Bld: 126 mg/dL — ABNORMAL HIGH (ref 70–99)
Potassium: 3.4 mmol/L — ABNORMAL LOW (ref 3.5–5.1)
Sodium: 139 mmol/L (ref 135–145)
Total Bilirubin: 0.3 mg/dL (ref 0.0–1.2)
Total Protein: 7.2 g/dL (ref 6.5–8.1)

## 2024-07-23 LAB — CBC
HCT: 36.1 % (ref 36.0–46.0)
Hemoglobin: 12.6 g/dL (ref 12.0–15.0)
MCH: 31.2 pg (ref 26.0–34.0)
MCHC: 34.9 g/dL (ref 30.0–36.0)
MCV: 89.4 fL (ref 80.0–100.0)
Platelets: 263 K/uL (ref 150–400)
RBC: 4.04 MIL/uL (ref 3.87–5.11)
RDW: 12.7 % (ref 11.5–15.5)
WBC: 12.6 K/uL — ABNORMAL HIGH (ref 4.0–10.5)
nRBC: 0 % (ref 0.0–0.2)

## 2024-07-23 LAB — LIPASE, BLOOD: Lipase: 16 U/L (ref 11–51)

## 2024-07-23 LAB — HCG, SERUM, QUALITATIVE: Preg, Serum: NEGATIVE

## 2024-07-23 MED ORDER — SODIUM CHLORIDE 0.9 % IV BOLUS
1000.0000 mL | Freq: Once | INTRAVENOUS | Status: AC
  Administered 2024-07-23: 1000 mL via INTRAVENOUS

## 2024-07-23 MED ORDER — ONDANSETRON HCL 4 MG/2ML IJ SOLN
4.0000 mg | Freq: Once | INTRAMUSCULAR | Status: AC | PRN
  Administered 2024-07-23: 4 mg via INTRAVENOUS
  Filled 2024-07-23: qty 2

## 2024-07-23 MED ORDER — ONDANSETRON 4 MG PO TBDP: 4.0000 mg | ORAL_TABLET | Freq: Three times a day (TID) | ORAL | 0 refills | Status: AC | PRN

## 2024-07-23 MED ORDER — KETOROLAC TROMETHAMINE 15 MG/ML IJ SOLN
15.0000 mg | Freq: Once | INTRAMUSCULAR | Status: AC
  Administered 2024-07-23: 15 mg via INTRAVENOUS
  Filled 2024-07-23: qty 1
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
## 2024-07-23 NOTE — ED Triage Notes (Signed)
 Patient brought in by EMS from home with c/o nausea. Per EMS patient vomit 1 time while en route. Patient alert and oriented x4.

## 2024-07-29 ENCOUNTER — Other Ambulatory Visit (HOSPITAL_COMMUNITY): Payer: Self-pay

## 2024-07-29 ENCOUNTER — Other Ambulatory Visit (HOSPITAL_BASED_OUTPATIENT_CLINIC_OR_DEPARTMENT_OTHER): Payer: Self-pay

## 2024-07-29 ENCOUNTER — Emergency Department (HOSPITAL_BASED_OUTPATIENT_CLINIC_OR_DEPARTMENT_OTHER)
Admission: EM | Admit: 2024-07-29 | Discharge: 2024-07-29 | Disposition: A | Discharge status: Home / Self Care | Payer: Self-pay | Attending: Emergency Medicine | Admitting: Emergency Medicine

## 2024-07-29 ENCOUNTER — Encounter (HOSPITAL_BASED_OUTPATIENT_CLINIC_OR_DEPARTMENT_OTHER): Payer: Self-pay

## 2024-07-29 ENCOUNTER — Other Ambulatory Visit: Payer: Self-pay

## 2024-07-29 DIAGNOSIS — J101 Influenza due to other identified influenza virus with other respiratory manifestations: Secondary | ICD-10-CM | POA: Insufficient documentation

## 2024-07-29 DIAGNOSIS — R112 Nausea with vomiting, unspecified: Secondary | ICD-10-CM

## 2024-07-29 LAB — COMPREHENSIVE METABOLIC PANEL WITH GFR
ALT: 15 U/L (ref 0–44)
AST: 36 U/L (ref 15–41)
Albumin: 4.9 g/dL (ref 3.5–5.0)
Alkaline Phosphatase: 74 U/L (ref 38–126)
Anion gap: 19 — ABNORMAL HIGH (ref 5–15)
BUN: 12 mg/dL (ref 6–20)
CO2: 20 mmol/L — ABNORMAL LOW (ref 22–32)
Calcium: 9.6 mg/dL (ref 8.9–10.3)
Chloride: 102 mmol/L (ref 98–111)
Creatinine, Ser: 0.93 mg/dL (ref 0.44–1.00)
GFR, Estimated: >60 mL/min
Glucose, Bld: 102 mg/dL — ABNORMAL HIGH (ref 70–99)
Potassium: 3.8 mmol/L (ref 3.5–5.1)
Sodium: 141 mmol/L (ref 135–145)
Total Bilirubin: 0.3 mg/dL (ref 0.0–1.2)
Total Protein: 8.3 g/dL — ABNORMAL HIGH (ref 6.5–8.1)

## 2024-07-29 LAB — CBC WITH DIFFERENTIAL/PLATELET
Abs Immature Granulocytes: 0.02 10*3/uL (ref 0.00–0.07)
Basophils Absolute: 0 10*3/uL (ref 0.0–0.1)
Basophils Relative: 0 %
Eosinophils Absolute: 0.2 10*3/uL (ref 0.0–0.5)
Eosinophils Relative: 3 %
HCT: 41.7 % (ref 36.0–46.0)
Hemoglobin: 14.7 g/dL (ref 12.0–15.0)
Immature Granulocytes: 0 %
Lymphocytes Relative: 21 %
Lymphs Abs: 1.2 10*3/uL (ref 0.7–4.0)
MCH: 31 pg (ref 26.0–34.0)
MCHC: 35.3 g/dL (ref 30.0–36.0)
MCV: 88 fL (ref 80.0–100.0)
Monocytes Absolute: 0.9 10*3/uL (ref 0.1–1.0)
Monocytes Relative: 15 %
Neutro Abs: 3.7 10*3/uL (ref 1.7–7.7)
Neutrophils Relative %: 61 %
Platelets: 202 10*3/uL (ref 150–400)
RBC: 4.74 MIL/uL (ref 3.87–5.11)
RDW: 12.4 % (ref 11.5–15.5)
WBC: 6 10*3/uL (ref 4.0–10.5)
nRBC: 0 % (ref 0.0–0.2)

## 2024-07-29 LAB — RESP PANEL BY RT-PCR (RSV, FLU A&B, COVID)  RVPGX2
Influenza A by PCR: NEGATIVE
Influenza B by PCR: POSITIVE — AB
Resp Syncytial Virus by PCR: NEGATIVE
SARS Coronavirus 2 by RT PCR: NEGATIVE

## 2024-07-29 LAB — LIPASE, BLOOD: Lipase: 21 U/L (ref 11–51)

## 2024-07-29 LAB — HCG, SERUM, QUALITATIVE: Preg, Serum: NEGATIVE

## 2024-07-29 MED ORDER — ONDANSETRON 4 MG PO TBDP
4.0000 mg | ORAL_TABLET | Freq: Three times a day (TID) | ORAL | 0 refills | Status: AC | PRN
  Filled 2024-07-29: qty 20, 7d supply, fill #0

## 2024-07-29 MED ORDER — ONDANSETRON HCL 4 MG/2ML IJ SOLN
4.0000 mg | Freq: Once | INTRAMUSCULAR | Status: AC
  Administered 2024-07-29: 4 mg via INTRAVENOUS
  Filled 2024-07-29: qty 2

## 2024-07-29 MED ORDER — BENZONATATE 100 MG PO CAPS: 100.0000 mg | ORAL_CAPSULE | Freq: Three times a day (TID) | ORAL | 0 refills | Status: DC

## 2024-07-29 MED ORDER — BENZONATATE 100 MG PO CAPS
100.0000 mg | ORAL_CAPSULE | Freq: Three times a day (TID) | ORAL | 0 refills | Status: AC
  Filled 2024-07-29: qty 21, 7d supply, fill #0

## 2024-07-29 MED ORDER — SODIUM CHLORIDE 0.9 % IV BOLUS
1000.0000 mL | Freq: Once | INTRAVENOUS | Status: AC
  Administered 2024-07-29: 1000 mL via INTRAVENOUS

## 2024-07-29 NOTE — Discharge Instructions (Addendum)
 Please visit Department of Social Services to speak with a Medicaid Eligibility caseworker.   You tested positive for the flu today which is likely the cause of your symptoms.  As this is a viral illness, no antibiotics are indicated.  I have given you prescription for Zofran  and Tessalon  which are medicines that you can take as prescribed for management of your symptoms.  They should be available to you without discharge.  I recommend that you get plenty of rest and focus on symptomatic relief which includes Cepacol throat lozenges for sore throat, Mucinex  D (orange box) which you can get from behind the counter at your local pharmacy for congestion, and tylenol /ibuprofen  as needed for fevers and bodyaches. I also recommend:  Increased fluid intake. Sports drinks offer valuable electrolytes, sugars, and fluids.  Breathing heated mist or steam (vaporizer or shower).  Eating chicken soup or other clear broths, and maintaining good nutrition.   Increasing usage of your inhaler if you have asthma.  Return to work when your temperature has returned to normal.  Gargle warm salt water  and spit it out for sore throat. Take benadryl  or Zyrtec to decrease sinus secretions.  Follow Up: Follow up with your primary care doctor in 5-7 days for recheck of ongoing symptoms.  Return to emergency department for emergent changing or worsening of symptoms.
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
## 2024-07-29 NOTE — Progress Notes (Signed)
 CSW added into secure chat after provided stated pt current lives in car and inquired about her qualifying for Medicaid. CSW attached Shelter and Social Services resources to AVS. Notated pt to visit DSS to speak with medicaid eligibility caseworker.

## 2024-07-29 NOTE — ED Notes (Signed)
 ED Provider at bedside.

## 2024-07-29 NOTE — ED Triage Notes (Signed)
 Pt was here x3 days ago for dehydration. Pt said she didn't feel better at discharge. Pt now reporting chills, headache, vomiting.
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
## 2024-07-29 NOTE — ED Notes (Signed)
 Lab notified of serum hcg add-on.

## 2024-08-06 ENCOUNTER — Other Ambulatory Visit: Payer: Self-pay

## 2024-08-06 ENCOUNTER — Emergency Department (HOSPITAL_COMMUNITY)
Admission: EM | Admit: 2024-08-06 | Discharge: 2024-08-06 | Disposition: A | Discharge status: Home / Self Care | Payer: Self-pay | Attending: Emergency Medicine | Admitting: Emergency Medicine

## 2024-08-06 ENCOUNTER — Encounter (HOSPITAL_COMMUNITY): Payer: Self-pay

## 2024-08-06 ENCOUNTER — Emergency Department (HOSPITAL_COMMUNITY): Payer: Self-pay

## 2024-08-06 DIAGNOSIS — R0602 Shortness of breath: Secondary | ICD-10-CM | POA: Insufficient documentation

## 2024-08-06 DIAGNOSIS — R0789 Other chest pain: Secondary | ICD-10-CM | POA: Insufficient documentation

## 2024-08-06 MED ORDER — IBUPROFEN 200 MG PO TABS
600.0000 mg | ORAL_TABLET | Freq: Once | ORAL | Status: AC
  Administered 2024-08-06: 600 mg via ORAL
  Filled 2024-08-06: qty 3
